# Patient Record
Sex: Male | Born: 2000 | Race: White | Hispanic: No | Marital: Single | Smoking: Never smoker
Health system: Southern US, Community
[De-identification: ages and names within clinical notes are randomized; demographics above are authoritative.]

---
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
## 2022-11-08 DIAGNOSIS — J0120 Acute ethmoidal sinusitis, unspecified: Secondary | ICD-10-CM | POA: Diagnosis not present

## 2022-11-08 DIAGNOSIS — R030 Elevated blood-pressure reading, without diagnosis of hypertension: Secondary | ICD-10-CM | POA: Diagnosis not present

## 2022-11-26 DIAGNOSIS — Z6821 Body mass index (BMI) 21.0-21.9, adult: Secondary | ICD-10-CM | POA: Diagnosis not present

## 2022-11-26 DIAGNOSIS — H10022 Other mucopurulent conjunctivitis, left eye: Secondary | ICD-10-CM | POA: Diagnosis not present

## 2023-05-09 DIAGNOSIS — S99921A Unspecified injury of right foot, initial encounter: Secondary | ICD-10-CM | POA: Diagnosis not present
